# Patient Record
Sex: Female | Born: 2000 | Race: Black or African American | Hispanic: No | Marital: Single | Smoking: Never smoker
Health system: Southern US, Community
[De-identification: ages and names within clinical notes are randomized; demographics above are authoritative.]

## PROBLEM LIST (undated history)

## (undated) DIAGNOSIS — F909 Attention-deficit hyperactivity disorder, unspecified type: Secondary | ICD-10-CM

## (undated) DIAGNOSIS — D509 Iron deficiency anemia, unspecified: Secondary | ICD-10-CM

## (undated) DIAGNOSIS — N920 Excessive and frequent menstruation with regular cycle: Secondary | ICD-10-CM

## (undated) DIAGNOSIS — T7840XA Allergy, unspecified, initial encounter: Secondary | ICD-10-CM

## (undated) HISTORY — DX: Excessive and frequent menstruation with regular cycle: N92.0

## (undated) HISTORY — DX: Iron deficiency anemia, unspecified: D50.9

## (undated) HISTORY — DX: Allergy, unspecified, initial encounter: T78.40XA

## (undated) HISTORY — DX: Attention-deficit hyperactivity disorder, unspecified type: F90.9

---
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
## 2022-02-17 ENCOUNTER — Other Ambulatory Visit: Payer: Self-pay

## 2022-02-17 ENCOUNTER — Ambulatory Visit
Admission: EM | Admit: 2022-02-17 | Discharge: 2022-02-17 | Disposition: A | Discharge status: Home / Self Care | Payer: Medicaid Other
